# Patient Record
Sex: Female | Born: 1960 | Race: White | Hispanic: No | Smoking: Former smoker
Health system: Southern US, Community
[De-identification: ages and names within clinical notes are randomized; demographics above are authoritative.]

## PROBLEM LIST (undated history)

## (undated) DIAGNOSIS — B019 Varicella without complication: Secondary | ICD-10-CM

## (undated) DIAGNOSIS — R519 Headache, unspecified: Secondary | ICD-10-CM

## (undated) DIAGNOSIS — J449 Chronic obstructive pulmonary disease, unspecified: Secondary | ICD-10-CM

## (undated) DIAGNOSIS — IMO0002 Reserved for concepts with insufficient information to code with codable children: Secondary | ICD-10-CM

## (undated) DIAGNOSIS — E079 Disorder of thyroid, unspecified: Secondary | ICD-10-CM

## (undated) DIAGNOSIS — G43909 Migraine, unspecified, not intractable, without status migrainosus: Secondary | ICD-10-CM

## (undated) DIAGNOSIS — T7840XA Allergy, unspecified, initial encounter: Secondary | ICD-10-CM

## (undated) DIAGNOSIS — R51 Headache: Secondary | ICD-10-CM

## (undated) HISTORY — DX: Varicella without complication: B01.9

## (undated) HISTORY — DX: Allergy, unspecified, initial encounter: T78.40XA

## (undated) HISTORY — DX: Chronic obstructive pulmonary disease, unspecified: J44.9

## (undated) HISTORY — PX: CHOLECYSTECTOMY: SHX55

## (undated) HISTORY — DX: Migraine, unspecified, not intractable, without status migrainosus: G43.909

## (undated) HISTORY — DX: Reserved for concepts with insufficient information to code with codable children: IMO0002

## (undated) HISTORY — PX: SMALL INTESTINE SURGERY: SHX150

## (undated) HISTORY — DX: Headache: R51

## (undated) HISTORY — DX: Disorder of thyroid, unspecified: E07.9

## (undated) HISTORY — DX: Headache, unspecified: R51.9

---
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
## 1984-06-01 DIAGNOSIS — E079 Disorder of thyroid, unspecified: Secondary | ICD-10-CM

## 1984-06-01 HISTORY — DX: Disorder of thyroid, unspecified: E07.9

## 1988-06-01 HISTORY — PX: TUBAL LIGATION: SHX77

## 2002-06-01 HISTORY — PX: STOMACH SURGERY: SHX791

## 2002-11-30 HISTORY — PX: OTHER SURGICAL HISTORY: SHX169

## 2004-03-04 ENCOUNTER — Ambulatory Visit: Payer: Self-pay | Admitting: Internal Medicine

## 2004-03-12 ENCOUNTER — Ambulatory Visit: Payer: Self-pay | Admitting: Internal Medicine

## 2004-04-04 ENCOUNTER — Ambulatory Visit: Payer: Self-pay | Admitting: Internal Medicine

## 2004-06-22 LAB — HM COLONOSCOPY: HM Colonoscopy: NORMAL

## 2006-04-19 ENCOUNTER — Emergency Department: Payer: Self-pay | Admitting: Emergency Medicine

## 2006-07-18 ENCOUNTER — Emergency Department: Payer: Self-pay | Admitting: Emergency Medicine

## 2006-10-06 ENCOUNTER — Emergency Department: Payer: Self-pay | Admitting: Emergency Medicine

## 2007-04-18 ENCOUNTER — Emergency Department: Payer: Self-pay | Admitting: Emergency Medicine

## 2007-07-15 ENCOUNTER — Emergency Department: Payer: Self-pay | Admitting: Emergency Medicine

## 2008-08-15 ENCOUNTER — Emergency Department: Payer: Self-pay | Admitting: Unknown Physician Specialty

## 2010-06-21 LAB — HM MAMMOGRAPHY

## 2011-02-25 ENCOUNTER — Ambulatory Visit: Payer: Self-pay | Admitting: Family Medicine

## 2011-06-08 ENCOUNTER — Ambulatory Visit: Payer: Self-pay | Admitting: Family Medicine

## 2011-07-15 LAB — HM PAP SMEAR: HM Pap smear: NORMAL

## 2012-06-01 DIAGNOSIS — J449 Chronic obstructive pulmonary disease, unspecified: Secondary | ICD-10-CM

## 2012-06-01 HISTORY — DX: Chronic obstructive pulmonary disease, unspecified: J44.9

## 2012-06-21 LAB — HM PAP SMEAR: HM PAP: NORMAL

## 2013-05-01 LAB — CBC AND DIFFERENTIAL
Hemoglobin: 13.7 g/dL (ref 12.0–16.0)
PLATELETS: 271 10*3/uL (ref 150–399)
WBC: 4.6 10^3/mL

## 2013-05-01 LAB — BASIC METABOLIC PANEL
BUN: 10 mg/dL (ref 4–21)
Creatinine: 0.9 mg/dL (ref 0.5–1.1)
Glucose: 94 mg/dL
Potassium: 4.4 mmol/L (ref 3.4–5.3)
SODIUM: 141 mmol/L (ref 137–147)

## 2013-05-01 LAB — HEPATIC FUNCTION PANEL
ALT: 20 U/L (ref 7–35)
AST: 23 U/L (ref 13–35)
Alkaline Phosphatase: 82 U/L (ref 25–125)
Bilirubin, Total: 0.2 mg/dL

## 2013-05-08 ENCOUNTER — Ambulatory Visit: Payer: Self-pay | Admitting: Family Medicine

## 2013-06-21 ENCOUNTER — Ambulatory Visit (INDEPENDENT_AMBULATORY_CARE_PROVIDER_SITE_OTHER): Payer: Managed Care, Other (non HMO) | Admitting: Internal Medicine

## 2013-06-21 ENCOUNTER — Encounter: Payer: Self-pay | Admitting: Internal Medicine

## 2013-06-21 ENCOUNTER — Encounter (INDEPENDENT_AMBULATORY_CARE_PROVIDER_SITE_OTHER): Payer: Self-pay

## 2013-06-21 VITALS — BP 132/74 | HR 73 | Temp 98.1°F | Resp 16 | Ht 64.1 in | Wt 107.5 lb

## 2013-06-21 DIAGNOSIS — Z1239 Encounter for other screening for malignant neoplasm of breast: Secondary | ICD-10-CM

## 2013-06-21 DIAGNOSIS — K2970 Gastritis, unspecified, without bleeding: Secondary | ICD-10-CM

## 2013-06-21 DIAGNOSIS — R634 Abnormal weight loss: Secondary | ICD-10-CM

## 2013-06-21 DIAGNOSIS — Z8719 Personal history of other diseases of the digestive system: Secondary | ICD-10-CM

## 2013-06-21 DIAGNOSIS — K2990 Gastroduodenitis, unspecified, without bleeding: Secondary | ICD-10-CM

## 2013-06-21 DIAGNOSIS — R1013 Epigastric pain: Secondary | ICD-10-CM

## 2013-06-21 DIAGNOSIS — Z7189 Other specified counseling: Secondary | ICD-10-CM

## 2013-06-21 DIAGNOSIS — G8929 Other chronic pain: Secondary | ICD-10-CM

## 2013-06-21 DIAGNOSIS — E039 Hypothyroidism, unspecified: Secondary | ICD-10-CM

## 2013-06-21 DIAGNOSIS — F17200 Nicotine dependence, unspecified, uncomplicated: Secondary | ICD-10-CM

## 2013-06-21 DIAGNOSIS — Z1211 Encounter for screening for malignant neoplasm of colon: Secondary | ICD-10-CM

## 2013-06-21 DIAGNOSIS — Z716 Tobacco abuse counseling: Secondary | ICD-10-CM

## 2013-06-21 DIAGNOSIS — K5900 Constipation, unspecified: Secondary | ICD-10-CM | POA: Insufficient documentation

## 2013-06-21 DIAGNOSIS — Z720 Tobacco use: Secondary | ICD-10-CM

## 2013-06-21 NOTE — Patient Instructions (Signed)
Please get your blood drawn today at [facility] so we can fill your thyroid medication  Trial of daily nexium for your stomach issues  Stop the Excedrin,  Use tylenol for headaches.  Avoid any product that has "salicylic acid" because that mean it has acid in it  I will review the [facility]  Mammogram has been ordered  Follow up in 6 months, sooner if we do not find a source for the weight loss or if th abdominal pain does not improve  The take home stool test will check for microscopic anounts of blood which may indicate another ulcer or a colon polyp

## 2013-06-21 NOTE — Progress Notes (Addendum)
Patient ID: Katie Booth, female   DOB: November 05, 1960, 53 y.o.   MRN: [id number]  Patient Active Problem List   Diagnosis Date Noted  . Abdominal pain, chronic, epigastric 06/22/2013  . Unspecified hypothyroidism 06/22/2013  . Tobacco abuse 06/22/2013  . Screening for breast cancer 06/22/2013  . Screening for colon cancer 06/22/2013  . Tobacco abuse counseling 06/22/2013  . Unspecified constipation 06/21/2013    Subjective:  CC:   Chief Complaint  Patient presents with  . Establish Care    HPI:   Katie Booth is a 53 y.o. female who presents as a new patient to establish primary care with the chief complaint of  Multiple issues to discuss.   Left axillary pain.  Occurred about two weeks ago, no swelling, but pain would inttrmittently feel like pressure and tenderness and then become moderately painful but never severe,  No history of URI of strenuous activity.  Not aggravated by movement of arm.  Resolved currently.   Hypothyroidism , occurred 1 yr post partum .  Had gained > 30 lbs with pregnancy and could not lose the weight despite dieting using very severe calorie restriction.  last TSH October dose was changed  And needs repeat TSH.  Recurrent episodes of  mid epigastric pain occurring at rest, often after she takes the thyroid medication ,  Onset about 45 minutes later,  At times severe,  Relieved with ranitidine  Had a skin papule occur on her chest wall that was was tender to palpation and hard to the touch.  Occurred this summer,  Was present for several weeks,  Then "fell off" in the shower, skin underneath was pink.,  Has not recurred.   History of duodenal ulcer which rupturde with bowel perforation in 2004.  Occurred after months of recurrent emesis.   Had been taking goody powders excessively for headaches.  Was hospitalized at [facility] for the surgery  , after being misdiagnosed at [facility] with a pelvic ultrasound done one month prior to rupture.   Now Uses Excedrin for  headaches,  Averages 2 pills several times per week. Not aware that it had aspirin in it.  Also Takes "sinus tablet" frequently for ronic congestion;  tylenol   Has lost 20 lbs since June unintentionally.  (wanted to lose 7 lbs)  Appetite voracious.   Smoker,  Not ready to quit,  Had a chest x ray several weeks ago at [facility]  Which noted early COPD .   Uses a stool softener daily  Has BMS 3/week no straining,  No blood.       Past Medical History  Diagnosis Date  . Chicken pox 1968  . COPD (chronic obstructive pulmonary disease) 2014    [facility]  . Sinus headache   . Ulcer   . Allergy   . Thyroid disease 1986  . Migraines     Past Surgical History  Procedure Laterality Date  . Tubal ligation  1990  . Cyst ecytomy    . Cholecystectomy    . Tonsillectomy and adenoidectomy  1964  . Stomach surgery  2004  . Small intestine surgery      Family History  Problem Relation Age of Onset  . Adopted: Yes  . Family history unknown: Yes    History   Social History  . Marital Status: Divorced    Spouse Name: N/A    Number of Children: N/A  . Years of Education: N/A   Occupational History  . Not on file.  Social History Main Topics  . Smoking status: Current Every Day Smoker -- 0.50 packs/day    Types: Cigarettes  . Smokeless tobacco: Never Used  . Alcohol Use: No     Comment: occasionally wine  . Drug Use: No  . Sexual Activity: No   Other Topics Concern  . Not on file   Social History Narrative  . No narrative on file   Allergies  Allergen Reactions  . Bee Venom Anaphylaxis  . Penicillins Anaphylaxis      Review of Systems:   The remainder of the review of systems was negative except those addressed in the HPI.   Objective:  BP 132/74  Pulse 73  Temp(Src) 98.1 F (36.7 C) (Oral)  Resp 16  Ht 5' 4.1" (1.628 m)  Wt 107 lb 8 oz (48.762 kg)  BMI 18.40 kg/m2  SpO2 93%  General appearance: alert, cooperative and appears stated age Ears: normal  TM's and external ear canals both ears Throat: lips, mucosa, and tongue normal; teeth and gums normal Neck: no adenopathy, no carotid bruit, supple, symmetrical, trachea midline and thyroid not enlarged, symmetric, no tenderness/mass/nodules Back: symmetric, no curvature. ROM normal. No CVA tenderness. Lungs: clear to auscultation bilaterally Axillary: no axillary masses or lymphadenoapthy Heart: regular rate and rhythm, S1, S2 normal, no murmur, click, rub or gallop Abdomen: soft, non-tender; bowel sounds normal; no masses,  no organomegaly Pulses: 2+ and symmetric Skin: Skin color, texture, turgor normal. No rashes or lesions Lymph nodes: Cervical, supraclavicular, and axillary nodes normal.  Assessment and Plan:  Abdominal pain, chronic, epigastric She has no rebound or guarding on exam to suggest recurrence of perforated viscus secondary to duodenal ulcer. However her recurrent use of Excedrin is concerning for gastritis or early ulcer. She was unaware that Excedrin is aspirin containing. Education given. Proton pump inhibitor samples given of Nexium . Patient sent home with fecal occult blood testing. Records requested from [facility].  Unspecified hypothyroidism History of onset occurred postpartum. She had her thyroid medication dose change in October and is due for repeat TSH. She is a [employer] and will have her labs drawn at facility tomorrow.  Screening for colon cancer She reportedly had a colonoscopy in 2006 for hematochezia. Records requested.  Screening for breast cancer Her last mammogram was in 2012 or  2013. This is been ordered today.  Unintended weight loss Her unintended weight loss of 20 pounds may be due to overactive thyroid versus occult malignancy. TSH is pending. She has had a recent chest x-ray which did not show any lesions. Mammogram has been ordered. She is up-to-date on screening for cervical and colon cancer. Nevertheless I have sent her  home with a take home stool card which is positive will require repeat endoscopy and colonoscopy.   Tobacco abuse Patient has evidence of emphysema by  diagnostic chest x-ray which was obtained by review of records available on [place] [facility]. She was noted by lateral apical scarring which is considered to be unchanged from prior imaging.  History of duodenal ulcer Treated at [facility] in 2004 for perforated viscus secondary to duodenal ulcer secondary to overuse of aspirin products.  Followup at [facility] until 2006 per year of records available online. She had a history of persistent vomiting and continued use of aspirin products subsequently. Workup was negative for gastroparesis.    Updated Medication List Outpatient Encounter Prescriptions as of 06/21/2013  Medication Sig  . levothyroxine (SYNTHROID, LEVOTHROID) 137 MCG tablet  Take 1 tablet by mouth daily before breakfast.

## 2013-06-22 ENCOUNTER — Encounter: Payer: Self-pay | Admitting: Internal Medicine

## 2013-06-22 DIAGNOSIS — Z720 Tobacco use: Secondary | ICD-10-CM | POA: Insufficient documentation

## 2013-06-22 DIAGNOSIS — Z1239 Encounter for other screening for malignant neoplasm of breast: Secondary | ICD-10-CM | POA: Insufficient documentation

## 2013-06-22 DIAGNOSIS — G8929 Other chronic pain: Secondary | ICD-10-CM | POA: Insufficient documentation

## 2013-06-22 DIAGNOSIS — R1013 Epigastric pain: Secondary | ICD-10-CM

## 2013-06-22 DIAGNOSIS — E039 Hypothyroidism, unspecified: Secondary | ICD-10-CM | POA: Insufficient documentation

## 2013-06-22 DIAGNOSIS — Z8719 Personal history of other diseases of the digestive system: Secondary | ICD-10-CM | POA: Insufficient documentation

## 2013-06-22 DIAGNOSIS — Z716 Tobacco abuse counseling: Secondary | ICD-10-CM | POA: Insufficient documentation

## 2013-06-22 DIAGNOSIS — R634 Abnormal weight loss: Secondary | ICD-10-CM | POA: Insufficient documentation

## 2013-06-22 DIAGNOSIS — Z1211 Encounter for screening for malignant neoplasm of colon: Secondary | ICD-10-CM | POA: Insufficient documentation

## 2013-06-22 NOTE — Assessment & Plan Note (Signed)
Her last mammogram was in 2012 or  2013. This is been ordered today.

## 2013-06-22 NOTE — Assessment & Plan Note (Addendum)
History of onset occurred postpartum. She had her thyroid medication dose change in October and is due for repeat TSH. She is a [employer] employee and will have her labs drawn at facility tomorrow.  Repeat TSH is supressed.  Dose lowered,  Repeat TSH i 6 weeks

## 2013-06-22 NOTE — Assessment & Plan Note (Signed)
Her unintended weight loss of 20 pounds may be due to overactive thyroid versus occult malignancy. TSH is pending. She has had a recent chest x-ray which did not show any lesions. Mammogram has been ordered. She is up-to-date on screening for cervical and colon cancer. Nevertheless I have sent her home with a take home stool card which is positive will require repeat endoscopy and colonoscopy.

## 2013-06-22 NOTE — Assessment & Plan Note (Addendum)
She has no rebound or guarding on exam to suggest recurrence of perforated viscus secondary to duodenal ulcer. However her recurrent use of Excedrin is concerning for gastritis or early ulcer. She was unaware that Excedrin is aspirin containing. Education given. Proton pump inhibitor samples given of Nexium . Patient sent home with fecal occult blood testing. [facility].

## 2013-06-22 NOTE — Assessment & Plan Note (Signed)
[facility] for perforated viscus secondary to duodenal ulcer secondary to overuse of aspirin products.  [facility] per year of records available online. She had a history of persistent vomiting and continued use of aspirin products subsequently. Workup was negative for gastroparesis.

## 2013-06-22 NOTE — Assessment & Plan Note (Signed)
Patient has evidence of emphysema by  diagnostic chest x-ray which was obtained by review of records available on [place] [facility]. She was noted by lateral apical scarring which is considered to be unchanged from prior imaging.

## 2013-06-22 NOTE — Assessment & Plan Note (Signed)
She reportedly had a colonoscopy in 2006 for hematochezia. Records requested.

## 2013-06-23 ENCOUNTER — Encounter: Payer: Self-pay | Admitting: Emergency Medicine

## 2013-06-23 LAB — H. PYLORI ANTIBODY, IGG: H Pylori IgG: <0.9 U/mL (ref 0.0–0.8)

## 2013-06-23 LAB — TSH: TSH: 0.091 u[IU]/mL — ABNORMAL LOW (ref 0.450–4.500)

## 2013-06-25 MED ORDER — LEVOTHYROXINE SODIUM 112 MCG PO TABS: 112.0000 ug | ORAL_TABLET | Freq: Every day | ORAL | Status: DC

## 2013-06-25 NOTE — Addendum Note (Signed)
Addended by: Sherlene ShamsULLO, Hardie Veltre L on: 06/25/2013 08:20 AM   Modules accepted: Orders

## 2013-06-27 ENCOUNTER — Other Ambulatory Visit: Payer: Self-pay | Admitting: *Deleted

## 2013-06-27 DIAGNOSIS — E039 Hypothyroidism, unspecified: Secondary | ICD-10-CM

## 2013-06-27 MED ORDER — LEVOTHYROXINE SODIUM 112 MCG PO TABS: 112.0000 ug | ORAL_TABLET | Freq: Every day | ORAL | Status: DC

## 2013-07-02 ENCOUNTER — Telehealth: Payer: Self-pay | Admitting: Internal Medicine

## 2013-07-04 ENCOUNTER — Telehealth: Payer: Self-pay | Admitting: Internal Medicine

## 2013-07-04 NOTE — Telephone Encounter (Signed)
Relevant patient education assigned to patient using Emmi. ° °

## 2013-07-05 ENCOUNTER — Ambulatory Visit: Payer: Self-pay | Admitting: Internal Medicine

## 2013-07-05 DIAGNOSIS — R920 Mammographic microcalcification found on diagnostic imaging of breast: Secondary | ICD-10-CM | POA: Insufficient documentation

## 2013-07-07 ENCOUNTER — Encounter: Payer: Self-pay | Admitting: General Surgery

## 2013-07-07 ENCOUNTER — Telehealth: Payer: Self-pay | Admitting: Emergency Medicine

## 2013-07-07 ENCOUNTER — Ambulatory Visit: Payer: Self-pay | Admitting: Internal Medicine

## 2013-07-07 NOTE — Telephone Encounter (Signed)
I spoke with patient about need for biopsy  Please call her on the 222 phone number ,  She does not use the other.

## 2013-07-07 NOTE — Telephone Encounter (Signed)
Pt aware of apt.  

## 2013-07-07 NOTE — Telephone Encounter (Signed)
Pt is scheduled with [facility] on 07/12/13 at 130. Arriving @ 1pm. I have no contacted the patient yet. PT is aware per [facility] she needs biopsy, they will let her know she will expect call from out office.

## 2013-07-07 NOTE — Telephone Encounter (Signed)
Per TT, use [facility].

## 2013-07-07 NOTE — Telephone Encounter (Signed)
Marchelle FolksAmanda from [facility] called to inform us the patient has been given a BiRads 4 mammogram, recommended biopsy of R breast for calcifications. Surgeon or [facility]?

## 2013-07-12 ENCOUNTER — Ambulatory Visit (INDEPENDENT_AMBULATORY_CARE_PROVIDER_SITE_OTHER): Payer: Managed Care, Other (non HMO) | Admitting: General Surgery

## 2013-07-12 ENCOUNTER — Encounter: Payer: Self-pay | Admitting: General Surgery

## 2013-07-12 VITALS — BP 120/74 | HR 86 | Resp 12 | Ht 64.0 in | Wt 111.0 lb

## 2013-07-12 DIAGNOSIS — R928 Other abnormal and inconclusive findings on diagnostic imaging of breast: Secondary | ICD-10-CM

## 2013-07-12 DIAGNOSIS — R920 Mammographic microcalcification found on diagnostic imaging of breast: Secondary | ICD-10-CM

## 2013-07-12 NOTE — Progress Notes (Signed)
Patient ID: Katie Booth, female   DOB: January 14, 1961, 53 y.o.   MRN: [id number]  Chief Complaint  Patient presents with  . Other    mammogram    HPI Katie Booth is a 53 y.o. female who presents for a breast evaluation. The most recent mammogram  was done on 07/05/13. Patient does perform regular self breast checks and  does not regular mammograms done. Previous mammogram 2013.  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  There is no history of trauma to the breast.  HPI  Past Medical History  Diagnosis Date  . Chicken pox 1968  . COPD (chronic obstructive pulmonary disease) 2014    [facility]  . Sinus headache   . Ulcer   . Allergy   . Thyroid disease 1986  . Migraines     Past Surgical History  Procedure Laterality Date  . Tubal ligation  1990  . Cyst ecytomy  11/2002  . Cholecystectomy    . Tonsillectomy and adenoidectomy  1964  . Stomach surgery  2004  . Small intestine surgery      Family History  Problem Relation Age of Onset  . Adopted: Yes    Social History History  Substance Use Topics  . Smoking status: Current Every Day Smoker -- 0.50 packs/day    Types: Cigarettes  . Smokeless tobacco: Never Used  . Alcohol Use: 0.0 oz/week     Comment: occasionally wine    Allergies  Allergen Reactions  . Bee Venom Anaphylaxis  . Penicillins Anaphylaxis    Current Outpatient Prescriptions  Medication Sig Dispense Refill  . aspirin-acetaminophen-caffeine (EXCEDRIN MIGRAINE) 250-250-65 MG per tablet Take by mouth every 6 (six) hours as needed for headache.      . levothyroxine (SYNTHROID, LEVOTHROID) 112 MCG tablet Take 1 tablet (112 mcg total) by mouth daily before breakfast.  90 tablet  0   No current facility-administered medications for this visit.    Review of Systems Review of Systems  Constitutional: Negative.   Respiratory: Negative.   Cardiovascular: Negative.     Blood pressure 120/74, pulse 86, resp. rate  12, height 5\' 4"  (1.626 m), weight 111 lb (50.349 kg).  Physical Exam Physical Exam  Constitutional: She is oriented to person, place, and time. She appears well-developed and well-nourished.  Eyes: Conjunctivae are normal.  Neck: Neck supple.  Cardiovascular: Normal rate, regular rhythm and normal heart sounds.   Pulmonary/Chest: Breath sounds normal. Right breast exhibits no inverted nipple, no mass, no nipple discharge, no skin change and no tenderness. Left breast exhibits no inverted nipple, no mass, no nipple discharge, no skin change and no tenderness. Breasts are asymmetrical (left breast smaller than right ).  Lymphadenopathy:    She has no cervical adenopathy.    She has no axillary adenopathy.  Neurological: She is alert and oriented to person, place, and time.  Skin: Skin is warm and dry.    Data Reviewed Screening mammogram dated 07/05/2013 suggested an area of microcalcifications in the right breast more pronounced from past exams. Focal spot compression views completed 07/07/2012 reported these as being indeterminate for which the radiologist recommended biopsy. BI-RAD-4. The left breast was unremarkable.  Assessment    Microcalcifications of the right breast    Plan    The 2013 2015 films are the only images available for review. Calcification were identified in 2013, and are slightly more pronounced at this time.  Has no family history is available for  the patient, it seems prudent to proceed with stereotactic biopsy. The risks and benefits of the procedure were discussed in detail.     Patient has been scheduled for a right breast stere[facility] for [facility]17[facility]13 at 3 pm. [facility] at 2:30 pm. This patient is aware of date, time, and instructions. Patient verbalizes understanding.  Katie Booth, Katie Booth 07/13/2013, 3:07 PM

## 2013-07-12 NOTE — Patient Instructions (Addendum)
Stereotactic Breast Biopsy  A stereotactic breast biopsy is a procedure in which mammography is used in the collection of a sample of breast tissue. Mammography is a type of X-ray exam of the breasts that produces an image called a mammogram. The mammogram allows your health care provider to precisely locate the area of the breast from which a tissue sample will be taken. The tissue is then examined under a microscope to see if cancerous cells are present. A breast biopsy is done when:   A lump, abnormality, or mass is seen in the breast on a breast X-ray (mammogram).   Small calcium deposits (calcifications) are seen in the breast.   The shape or appearance of the breasts changes.   The shape or appearance of the nipples changes. You may have unusual or bloody discharge coming from the nipples, or you may have crusting, retraction, or dimpling of the nipples. A breast biopsy can indicate if you need surgery or other treatment.  LET [facility] KNOW ABOUT:  Any allergies you have.  All medicines you are taking, including vitamins, herbs, eye drops, creams, and over-the-counter medicines.  Previous problems you or members of your family have had with the use of anesthetics.  Any blood disorders you have.  Previous surgeries you have had.  Medical conditions you have. RISKS AND COMPLICATIONS Generally, stereotactic breast biopsy is a safe procedure. However, as with any procedure, complications can occur. Possible complications include:  Infection at the needle-insertion site.   Bleeding or bruising after surgery.  The breast may become altered or deformed as a result of the procedure.  The needle may go through the chest wall into the lung area.  BEFORE THE PROCEDURE  Wear a supportive bra to the procedure.  You will be asked to remove jewelry, dentures, eyeglasses, metal objects, or clothing that might interfere with the X-ray images. You may want to leave  some of these objects at home.  Arrange for someone to drive you home after the procedure if desired. PROCEDURE  A stereotactic breast biopsy is done while you are awake. During the procedure, relax as much as possible. Let your health care provider know if you are uncomfortable, anxious, or in pain. Usually, the only discomfort felt during the procedure is caused by staying in one position for the length of the procedure. This discomfort can be reduced by carefully placed cushions. Most of the time the biopsy is done using a table with openings on it. You will be asked to lie facedown on the table and place your breasts through the openings. Your breast is compressed between metal plates to get good X-ray images. Your skin will be cleaned, and a numbing medicine (local anesthetic) will be injected. A small cut (incision) will be made in your breast. The tip of the biopsy needle will be directed through the incision. Several small pieces of suspicious tissue will be taken. Then, a final set of X-ray images will be obtained. If they show that the suspicious tissue has been mostly or completely removed, a small clip will be left at the biopsy site. This is done so that the biopsy site can be easily located if the results of the biopsy show that the tissue is cancerous.  After the procedure, the incision will be stitched (sutured) or taped and covered with a bandage (dressing). Your health care provider may apply a pressure dressing and an ice pack to prevent bleeding and swelling in the breast.  A  stereotactic breast biopsy can take 30 minutes or more. AFTER THE PROCEDURE  If you are doing well and have no problems, you will be allowed to go home.  Document Released: 02/14/2003 Document Revised: 01/18/2013 Document Reviewed: 12/15/2012 [facility] Patient Information 2014 [place], [place].  Patient has been scheduled for a right breast stereotactic biopsy at [facility] for 07-17-13 at 3 pm. She will check-in at the  [facility] at 2:30 pm. This patient is aware of date, time, and instructions. Patient verbalizes understanding.

## 2013-07-17 ENCOUNTER — Ambulatory Visit: Payer: Self-pay | Admitting: General Surgery

## 2013-07-17 DIAGNOSIS — R920 Mammographic microcalcification found on diagnostic imaging of breast: Secondary | ICD-10-CM

## 2013-07-18 ENCOUNTER — Ambulatory Visit: Payer: Managed Care, Other (non HMO) | Admitting: Internal Medicine

## 2013-07-19 ENCOUNTER — Telehealth: Payer: Self-pay | Admitting: *Deleted

## 2013-07-19 ENCOUNTER — Encounter: Payer: Self-pay | Admitting: Internal Medicine

## 2013-07-19 ENCOUNTER — Ambulatory Visit (INDEPENDENT_AMBULATORY_CARE_PROVIDER_SITE_OTHER): Payer: Managed Care, Other (non HMO) | Admitting: Internal Medicine

## 2013-07-19 VITALS — BP 128/70 | HR 95 | Temp 98.3°F | Resp 16 | Wt 108.8 lb

## 2013-07-19 DIAGNOSIS — Z7189 Other specified counseling: Secondary | ICD-10-CM

## 2013-07-19 DIAGNOSIS — F17200 Nicotine dependence, unspecified, uncomplicated: Secondary | ICD-10-CM

## 2013-07-19 DIAGNOSIS — Z716 Tobacco abuse counseling: Secondary | ICD-10-CM

## 2013-07-19 DIAGNOSIS — Z720 Tobacco use: Secondary | ICD-10-CM

## 2013-07-19 DIAGNOSIS — E039 Hypothyroidism, unspecified: Secondary | ICD-10-CM

## 2013-07-19 DIAGNOSIS — J42 Unspecified chronic bronchitis: Secondary | ICD-10-CM

## 2013-07-19 MED ORDER — BENZONATATE 200 MG PO CAPS: 200.0000 mg | ORAL_CAPSULE | Freq: Three times a day (TID) | ORAL | Status: AC | PRN

## 2013-07-19 MED ORDER — BUDESONIDE-FORMOTEROL FUMARATE 80-4.5 MCG/ACT IN AERO: 2.0000 | INHALATION_SPRAY | Freq: Two times a day (BID) | RESPIRATORY_TRACT | Status: DC

## 2013-07-19 NOTE — Telephone Encounter (Signed)
Notified patient as instructed, patient pleased. Discussed follow-up appointments, patient agrees. Placed in recalls for 6 months with right mammogram.

## 2013-07-19 NOTE — Telephone Encounter (Signed)
Phone call from Dr Oneita Krasubinas preliminary pathology, right breast biopsy, benign with calcifications, will go deeper on one block.

## 2013-07-19 NOTE — Progress Notes (Signed)
Patient ID: SIGRID SCHWEBACH, female   DOB: 1961/03/05, 53 y.o.   MRN: [id number]  Patient Active Problem List   Diagnosis Date Noted  . Unspecified chronic bronchitis 07/19/2013  . Breast microcalcification, mammographic 07/05/2013  . Abdominal pain, chronic, epigastric 06/22/2013  . Unspecified hypothyroidism 06/22/2013  . Tobacco abuse 06/22/2013  . Screening for breast cancer 06/22/2013  . Screening for colon cancer 06/22/2013  . Tobacco abuse counseling 06/22/2013  . Unintended weight loss 06/22/2013  . History of duodenal ulcer 06/22/2013  . Unspecified constipation 06/21/2013    Subjective:  CC:   Chief Complaint  Patient presents with  . Follow-up    For FMLA    HPI:   NITHILA SUMNERS is a 53 y.o. female who presents for evaluation and management of frequent episodes of bronchitis causing missed days of work.  She is requesting FMLA forms to be completed to cover her for any recent or future  unplanned absences from work due to [employer].  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  She states that has had two episodes this year already resulting in missed work, one in January prior to initiating primary care with me,  And on in early February, which I was not aware of until today.  She states that due to bronchitis and recurrent vomiting she missed January 8th, 9, 10 and 12 for the first episode.  Then on Feb 3rd left 1 hour early and was out  Feb 4 -7 due to cough .  No prior diagnosis of COPD ,  Was given an albuterol MDI by former PCP.  Still smoking but trying to quit by reducing daily consumption gradually.  Underwent breast biopsy last week by Dr. Lemar Livings.  Some residual discomfort in area of excision.     Past  Medical History  Diagnosis Date  . Chicken pox 1968  . COPD (chronic obstructive pulmonary disease) 2014    [facility]  . Sinus headache   . Ulcer   . Allergy   . Thyroid disease 1986  . Migraines     Past Surgical History  Procedure Laterality Date  . Tubal ligation  1990  . Cyst ecytomy  11/2002  . Cholecystectomy    . Tonsillectomy and adenoidectomy  1964  . Stomach surgery  2004  . Small intestine surgery         The following portions of the patient's history were reviewed and updated as appropriate: Allergies, current medications, and problem list.    Review of Systems:   Patient denies headache, fevers, malaise, unintentional weight loss, skin rash, eye pain, sinus congestion and sinus pain, sore throat, dysphagia,  hemoptysis , cough, dyspnea, wheezing, chest pain, palpitations, orthopnea, edema, abdominal pain, nausea, melena, diarrhea, constipation, flank pain, dysuria, hematuria, urinary  Frequency, nocturia, numbness, tingling, seizures,  Focal weakness, Loss of consciousness,  Tremor, insomnia, depression, anxiety, and suicidal ideation.     History   Social History  . Marital Status: Divorced    Spouse Name: N/A    Number of Children: N/A  . Years of Education: N/A   Occupational History  . Not on file.   Social History Main Topics  . Smoking status: Current Every Day Smoker -- 0.50 packs/day    Types: Cigarettes  .  Smokeless tobacco: Never Used  . Alcohol Use: 0.0 oz/week     Comment: occasionally wine  . Drug Use: No  . Sexual Activity: No   Other Topics Concern  . Not on file   Social History Narrative  . No narrative on file    Objective:  Filed Vitals:   07/19/13 1341  BP: 128/70  Pulse: 95  Temp: 98.3 F (36.8 C)  Resp: 16     General appearance: alert, cooperative and appears stated age Ears: normal TM's and external ear canals both ears Throat: lips, mucosa, and tongue normal; teeth and gums normal Neck: no adenopathy,  no carotid bruit, supple, symmetrical, trachea midline and thyroid not enlarged, symmetric, no tenderness/mass/nodules Back: symmetric, no curvature. ROM normal. No CVA tenderness. Lungs: clear to auscultation bilaterally Heart: regular rate and rhythm, S1, S2 normal, no murmur, click, rub or gallop Abdomen: soft, non-tender; bowel sounds normal; no masses,  no organomegaly Pulses: 2+ and symmetric Skin: Skin color, texture, turgor normal. No rashes or lesions Lymph nodes: Cervical, supraclavicular, and axillary nodes normal.  Assessment and Plan:  Unspecified chronic bronchitis Has had 2 episodes this year one in January and February  Already complicated by recurrent post tussive emesis,  Symbicort and tessalon perles prescribed today.  Tobacco cessation encouraged and discussed.  PFTs ordered for eval of lung function.  Advised to call office when symptoms recdur to avoid loss of work by treating early   Unspecified hypothyroidism TSH was suppressed last month and dose was reduced.  Her weight loss has stopped and she feels better..  Continue current dose and recheck in a few weeks at [facility].   Tobacco abuse counseling Smoking cessation instruction/counseling given:  counseled patient on the dangers of tobacco use, advised patient to stop smoking, and reviewed strategies to maximize success  A total of 40 minutes was spent with patient more than half of which was spent in counseling, reviewing records from other prviders and coordination of care.   Updated Medication List Outpatient Encounter Prescriptions as of 07/19/2013  Medication Sig  . aspirin-acetaminophen-caffeine (EXCEDRIN MIGRAINE) 250-250-65 MG per tablet Take by mouth every 6 (six) hours as needed for headache.  . levothyroxine (SYNTHROID, LEVOTHROID) 112 MCG tablet Take 1 tablet (112 mcg total) by mouth daily before breakfast.  . benzonatate (TESSALON) 200 MG capsule Take 1 capsule (200 mg total) by mouth 3 (three) times  daily as needed for cough.  . budesonide-formoterol (SYMBICORT) 80-4.5 MCG/ACT inhaler Inhale 2 puffs into the lungs 2 (two) times daily.     Orders Placed This Encounter  Procedures  . Pulmonary function test    No Follow-up on file.

## 2013-07-19 NOTE — Progress Notes (Signed)
Pre-visit discussion using our clinic review tool. No additional management support is needed unless otherwise documented below in the visit note.  

## 2013-07-19 NOTE — Patient Instructions (Signed)
Please start using the symbicort today.   This has a steroid and a bronchodilator for your lungs  2 puffs twice daily every day (to prevent bronchitis) Rinse your mouth after each use to prevent getting thrush  Tessalon capsules one tablet every 8 hours as needed for cough  Pulmonary function tests to be set up by our office to evaluate your risk for COPD

## 2013-07-19 NOTE — Assessment & Plan Note (Addendum)
Has had 2 episodes this year one in January and February  Already complicated by recurrent post tussive emesis,  Symbicort and tessalon perles prescribed today.  Tobacco cessation encouraged and discussed.  PFTs ordered for eval of lung function.  Advised to call office when symptoms recdur to avoid loss of work by treating early

## 2013-07-20 ENCOUNTER — Encounter: Payer: Self-pay | Admitting: Internal Medicine

## 2013-07-20 ENCOUNTER — Encounter: Payer: Self-pay | Admitting: General Surgery

## 2013-07-20 ENCOUNTER — Telehealth: Payer: Self-pay | Admitting: Internal Medicine

## 2013-07-20 LAB — PATHOLOGY REPORT

## 2013-07-20 NOTE — Assessment & Plan Note (Signed)
TSH was suppressed last month and dose was reduced.  Her weight loss has stopped and she feels better..  Continue current dose and recheck in a few weeks at [facility].

## 2013-07-20 NOTE — Assessment & Plan Note (Signed)
Smoking cessation instruction/counseling given:  counseled patient on the dangers of tobacco use, advised patient to stop smoking, and reviewed strategies to maximize success 

## 2013-07-20 NOTE — Telephone Encounter (Signed)
Relevant patient education assigned to patient using Emmi. ° °

## 2013-07-21 ENCOUNTER — Encounter: Payer: Self-pay | Admitting: General Surgery

## 2013-07-24 ENCOUNTER — Ambulatory Visit (INDEPENDENT_AMBULATORY_CARE_PROVIDER_SITE_OTHER): Payer: Managed Care, Other (non HMO) | Admitting: *Deleted

## 2013-07-24 DIAGNOSIS — R928 Other abnormal and inconclusive findings on diagnostic imaging of breast: Secondary | ICD-10-CM

## 2013-07-24 NOTE — Progress Notes (Signed)
Patient here today for follow up post right breast biopsy. Minimal bruising noted.  The patient is aware that a heating pad may be used for comfort as needed.  Aware of pathology. Follow up as scheduled. 

## 2013-07-28 ENCOUNTER — Encounter: Payer: Self-pay | Admitting: Internal Medicine

## 2013-07-31 ENCOUNTER — Encounter: Payer: Self-pay | Admitting: Internal Medicine

## 2013-08-02 ENCOUNTER — Telehealth: Payer: Self-pay | Admitting: *Deleted

## 2013-08-02 NOTE — Telephone Encounter (Signed)
Patient MFLA paperwork clarification on line 7. Patient just received back and is bring in tomorrow so we can clarify and insurance wants faxed back by Friday.

## 2013-08-03 NOTE — Telephone Encounter (Signed)
Pt in office to drop off the paperwork.  Placed in Tullo's box.

## 2013-08-03 NOTE — Telephone Encounter (Signed)
Placed in red folder , needs clarification on line 7 patient just received this paper work back and insurance is asking for it back by 08/04/13

## 2013-08-14 ENCOUNTER — Ambulatory Visit: Payer: Self-pay | Admitting: Internal Medicine

## 2013-08-14 LAB — PULMONARY FUNCTION TEST

## 2013-08-18 ENCOUNTER — Telehealth: Payer: Self-pay | Admitting: Internal Medicine

## 2013-08-18 NOTE — Telephone Encounter (Signed)
[facility] and ask for the interpretation on patient's recent PFTS.  I have the raw data but not the conclusion. [phone] Data is in your red folder

## 2013-08-18 NOTE — Telephone Encounter (Signed)
Called spoke with Meriam SpragueBeverly and she stated she would look into the conclusion to the test and have it faxed over today.FYI

## 2013-08-21 ENCOUNTER — Telehealth: Payer: Self-pay | Admitting: Internal Medicine

## 2013-08-21 DIAGNOSIS — J42 Unspecified chronic bronchitis: Secondary | ICD-10-CM

## 2013-08-21 NOTE — Telephone Encounter (Signed)
COPD is confirmed confirmed by PFTS . aRecommend referral to pulmnologist.  Would she like to proceed

## 2013-08-21 NOTE — Telephone Encounter (Signed)
Patient stated she would prefer to go ahead with referral.

## 2013-08-21 NOTE — Assessment & Plan Note (Signed)
COPD is confirmed confirmed by PFTS

## 2013-08-25 ENCOUNTER — Other Ambulatory Visit (INDEPENDENT_AMBULATORY_CARE_PROVIDER_SITE_OTHER): Payer: Managed Care, Other (non HMO)

## 2013-08-25 DIAGNOSIS — K2990 Gastroduodenitis, unspecified, without bleeding: Principal | ICD-10-CM

## 2013-08-25 DIAGNOSIS — K2970 Gastritis, unspecified, without bleeding: Secondary | ICD-10-CM

## 2013-08-25 LAB — FECAL OCCULT BLOOD, IMMUNOCHEMICAL: Fecal Occult Bld: NEGATIVE

## 2013-08-28 ENCOUNTER — Encounter: Payer: Self-pay | Admitting: *Deleted

## 2013-09-11 ENCOUNTER — Telehealth: Payer: Self-pay | Admitting: Internal Medicine

## 2013-09-11 DIAGNOSIS — J449 Chronic obstructive pulmonary disease, unspecified: Secondary | ICD-10-CM

## 2013-09-11 NOTE — Telephone Encounter (Signed)
Please advise.  Patient would like referral to Pulmonary

## 2013-09-11 NOTE — Telephone Encounter (Signed)
The patient is needing a referral to a pulmonary specialist.

## 2013-09-11 NOTE — Telephone Encounter (Signed)
Referral is in process as requested 

## 2013-09-12 ENCOUNTER — Telehealth: Payer: Self-pay | Admitting: *Deleted

## 2013-09-12 NOTE — Telephone Encounter (Signed)
Detailed message left per DPR 

## 2013-09-12 NOTE — Telephone Encounter (Signed)
Patient needs order for [facility] for TSH to check thyroid Please advise.

## 2013-09-12 NOTE — Telephone Encounter (Signed)
Please use [facility] order form and check box,  i will sign

## 2013-09-12 NOTE — Telephone Encounter (Signed)
[facility] [address] [facility]

## 2013-09-20 ENCOUNTER — Ambulatory Visit: Payer: Managed Care, Other (non HMO) | Admitting: General Surgery

## 2013-09-28 ENCOUNTER — Telehealth: Payer: Self-pay | Admitting: Internal Medicine

## 2013-09-28 DIAGNOSIS — E039 Hypothyroidism, unspecified: Secondary | ICD-10-CM

## 2013-09-28 MED ORDER — LEVOTHYROXINE SODIUM 125 MCG PO TABS: 125.0000 ug | ORAL_TABLET | Freq: Every day | ORAL | Status: DC

## 2013-09-28 NOTE — Telephone Encounter (Signed)
Her thyroid is extremely underactive.  Has she run out of her medication or missed  Bunch of doses? Or is she taking it incorrectly?   Higher dose sent to pharmacy repeat TSH in 6 week s

## 2013-09-28 NOTE — Telephone Encounter (Signed)
Patient stated she has not missed " but maybe a couple of doses here and there" advised patient a dose missed once in a while would not do this and that a new script has been called to pharmacy to start.

## 2013-10-04 ENCOUNTER — Encounter (INDEPENDENT_AMBULATORY_CARE_PROVIDER_SITE_OTHER): Payer: Self-pay

## 2013-10-04 ENCOUNTER — Encounter: Payer: Self-pay | Admitting: Pulmonary Disease

## 2013-10-04 ENCOUNTER — Ambulatory Visit (INDEPENDENT_AMBULATORY_CARE_PROVIDER_SITE_OTHER): Payer: Managed Care, Other (non HMO) | Admitting: Pulmonary Disease

## 2013-10-04 VITALS — BP 128/64 | HR 83 | Ht 64.0 in | Wt 113.0 lb

## 2013-10-04 DIAGNOSIS — R059 Cough, unspecified: Secondary | ICD-10-CM | POA: Insufficient documentation

## 2013-10-04 DIAGNOSIS — F17200 Nicotine dependence, unspecified, uncomplicated: Secondary | ICD-10-CM

## 2013-10-04 DIAGNOSIS — Z720 Tobacco use: Secondary | ICD-10-CM

## 2013-10-04 DIAGNOSIS — R05 Cough: Secondary | ICD-10-CM | POA: Insufficient documentation

## 2013-10-04 DIAGNOSIS — J42 Unspecified chronic bronchitis: Secondary | ICD-10-CM

## 2013-10-04 MED ORDER — GUAIFENESIN-CODEINE 100-10 MG/5ML PO SYRP
5.0000 mL | ORAL_SOLUTION | Freq: Three times a day (TID) | ORAL | Status: AC | PRN
Start: 2013-10-04 — End: ?

## 2013-10-04 MED ORDER — TIOTROPIUM BROMIDE MONOHYDRATE 18 MCG IN CAPS: 18.0000 ug | ORAL_CAPSULE | Freq: Every day | RESPIRATORY_TRACT | Status: AC

## 2013-10-04 NOTE — Progress Notes (Signed)
Subjective:    Patient ID: Katie Booth, female    DOB: 1960/11/21, 53 y.o.   MRN: [id number]  HPI  This is a very pleasant 53 year old female who smokes about a quarter pack of cigarettes daily he comes to our clinic today for further evaluation of COPD. She has smoked as much as one pack of cigarettes daily for 35 years and recently started coming back. She says that what bothers her most about her COPD is the cough that she experiences at night. She says that whenever she lays down flat she starts to have wheezing, cough, and chest tightness. 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 She does prop her head up. This is typically associated with the acute onset of wheezing and chest tightness.  Just a note shortness of breath and fatigue on exertion which is been increasing over the last 2 years. She does not use Symbicort on a regular basis because she does not like the way it makes her feel. She says that when she uses it it actually helps with her shortness of breath but it makes her feel quite anxious.  Allergies are typically a problem for her in the springtime with sinus congestion. This sometimes will go to her chest.  She has had to go to the doctor 2-3 times a year for the last several years for flares of bronchitis. This is typically treated with prednisone and antibiotics.  She has heartburn which is fairly well controlled right now.  Past Medical History  Diagnosis Date  . Chicken pox 1968  . COPD (chronic obstructive pulmonary disease) 2014    [facility]  . Sinus headache   . Ulcer   . Allergy   . Thyroid disease 1986  . Migraines      Family History  Problem Relation Age of Onset  . Adopted: Yes  . Family history unknown: Yes     History   Social History  . Marital Status: Divorced    Spouse Name: N/A    Number  of Children: N/A  . Years of Education: N/A   Occupational History  . Not on file.   Social History Main Topics  . Smoking status: Current Every Day Smoker -- 0.25 packs/day for 35 years    Types: Cigarettes  . Smokeless tobacco: Never Used  . Alcohol Use: 0.0 oz/week     Comment: occasionally wine  . Drug Use: No  . Sexual Activity: No   Other Topics Concern  . Not on file   Social History Narrative  . No narrative on file     Allergies  Allergen Reactions  . Bee Venom Anaphylaxis  . Penicillins Anaphylaxis     Outpatient Prescriptions Prior to Visit  Medication Sig Dispense Refill  . aspirin-acetaminophen-caffeine (EXCEDRIN MIGRAINE) 250-250-65 MG per tablet Take by mouth every 6 (six) hours as needed for headache.      . benzonatate (TESSALON) 200 MG capsule Take 1 capsule (200 mg total) by mouth 3 (three) times daily as needed for cough.  60 capsule  1  . budesonide-formoterol (SYMBICORT) 80-4.5 MCG/ACT inhaler Inhale 2 puffs into the lungs 2 (two) times daily.  1 Inhaler  12  . levothyroxine (SYNTHROID, LEVOTHROID) 125 MCG tablet Take 1 tablet (125 mcg total) by mouth daily before breakfast.  90 tablet  0  No facility-administered medications prior to visit.      Review of Systems  Constitutional: Positive for fatigue. Negative for fever and unexpected weight change.  HENT: Positive for congestion. Negative for dental problem, ear pain, nosebleeds, postnasal drip, rhinorrhea, sinus pressure, sneezing, sore throat and trouble swallowing.   Eyes: Negative for redness and itching.  Respiratory: Positive for cough, shortness of breath and wheezing. Negative for chest tightness.   Cardiovascular: Negative for palpitations and leg swelling.  Gastrointestinal: Negative for nausea and vomiting.  Genitourinary: Negative for dysuria.  Musculoskeletal: Negative for joint swelling.  Skin: Negative for rash.  Neurological: Negative for headaches.  Hematological: Does not  bruise/bleed easily.  Psychiatric/Behavioral: Negative for dysphoric mood. The patient is not nervous/anxious.        Objective:   Physical Exam Filed Vitals:   10/04/13 1000  BP: 128/64  Pulse: 83  Height: 5\' 4"  (1.626 m)  Weight: 113 lb (51.256 kg)  SpO2: 96%   Gen: well appearing, no acute distress HEENT: NCAT, PERRL, EOMi, OP clear, neck supple without masses PULM: Few wheezes bilaterally, good air movement CV: RRR, no mgr, no JVD AB: BS+, soft, nontender, no hsm Ext: warm, no edema, no clubbing, no cyanosis Derm: no rash or skin breakdown Neuro: A&Ox4, CN II-XII intact, strength 5/5 in all 4 extremities       Assessment & Plan:   Unspecified chronic bronchitis COPD: GOLD GRADE C  Her airflow obstruction is only mild but what bothers me is that she has multiple exacerbations per year. This is a high-risk feature. Her ongoing tobacco use is clearly contributing to this. Also, because she does not use her medications regularly this increases her risk for exacerbations.  -O2 therapy: Not indicated -Immunizations: Flu shot in the fall -Tobacco use: Advised at length to quit -Exercise: Encouraged regular activity -Bronchodilator therapy: Switch to Spriva daily -Exacerbation prevention: Quit smoking, Spriva     Tobacco abuse Advised at length to quit.  She is currently enrolled N/A quit smoking campaign at work. She is afraid of the side effects of Chantix.  Cough This is due to smoking, noncompliant with COPD medications, postnasal drip, and acid reflux.  Plan: -Quit smoking -Start Spiriva daily -GERD lifestyle modification reviewed -Start allergic rhinitis therapy (saline rinse, antihistamine, nasal steroid) disease - Cough syrup prescribed times one no refills   Updated Medication List Outpatient Encounter Prescriptions as of 10/04/2013  Medication Sig  . aspirin-acetaminophen-caffeine (EXCEDRIN MIGRAINE) 250-250-65 MG per tablet Take by mouth every 6  (six) hours as needed for headache.  . benzonatate (TESSALON) 200 MG capsule Take 1 capsule (200 mg total) by mouth 3 (three) times daily as needed for cough.  . levothyroxine (SYNTHROID, LEVOTHROID) 125 MCG tablet Take 1 tablet (125 mcg total) by mouth daily before breakfast.  . pseudoephedrine-acetaminophen (TYLENOL SINUS) 30-500 MG TABS Take 1 tablet by mouth every 6 (six) hours as needed.  . [DISCONTINUED] budesonide-formoterol (SYMBICORT) 80-4.5 MCG/ACT inhaler Inhale 2 puffs into the lungs 2 (two) times daily.  Marland Kitchen. guaiFENesin-codeine (CHERATUSSIN AC) 100-10 MG/5ML syrup Take 5 mLs by mouth 3 (three) times daily as needed for cough.  . tiotropium (SPIRIVA HANDIHALER) 18 MCG inhalation capsule Place 1 capsule (18 mcg total) into inhaler and inhale daily.

## 2013-10-04 NOTE — Assessment & Plan Note (Signed)
COPD: GOLD GRADE C  Her airflow obstruction is only mild but what bothers me is that she has multiple exacerbations per year. This is a high-risk feature. Her ongoing tobacco use is clearly contributing to this. Also, because she does not use her medications regularly this increases her risk for exacerbations.  -O2 therapy: Not indicated -Immunizations: Flu shot in the fall -Tobacco use: Advised at length to quit -Exercise: Encouraged regular activity -Bronchodilator therapy: Switch to Spriva daily -Exacerbation prevention: Quit smoking, Spriva

## 2013-10-04 NOTE — Assessment & Plan Note (Signed)
Advised at length to quit.  She is currently enrolled N/A quit smoking campaign at work. She is afraid of the side effects of Chantix.

## 2013-10-04 NOTE — Assessment & Plan Note (Signed)
This is due to smoking, noncompliant with COPD medications, postnasal drip, and acid reflux.  Plan: -Quit smoking -Start Spiriva daily -GERD lifestyle modification reviewed -Start allergic rhinitis therapy (saline rinse, antihistamine, nasal steroid) disease - Cough syrup prescribed times one no refills

## 2013-10-04 NOTE — Patient Instructions (Signed)
Stop symbicort Start spiriva once daily Quit smoking  Use the cough syrup before going to bed, but don't take it and drive  For cough (related to sinus problems and reflux): Follow the GERD lifestyle sheet Use Neil Med rinses with distilled water at least twice per day using the instructions on the package. 1/2 hour after using the [facility] rinse, use Nasacort two puffs in each nostril once per day. Use generic zyrtec daily If you are not better in 2 weeks, then start taking Prilosec 20mg  oral daily.   We will see you back in 3-4 weeks or sooner if needed

## 2013-10-24 ENCOUNTER — Encounter: Payer: Self-pay | Admitting: Internal Medicine

## 2013-11-22 ENCOUNTER — Telehealth: Payer: Self-pay | Admitting: Internal Medicine

## 2013-11-22 NOTE — Telephone Encounter (Signed)
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 Patient would like to explain why she cannot perform her job. Paper work is in [employer] folder.

## 2013-11-22 NOTE — Telephone Encounter (Signed)
I will not  continue to fill out disability /work excuse forms for COPD for her because she is still smoking and, per Dr Ulyses JarredMcQuaid's evaluation,  Not taking her medications as directed.  She will need an office visit to explain why she feels she needs to stay out of work

## 2013-11-22 NOTE — Telephone Encounter (Signed)
Please call patient before FMLA paper work is faxed by to [facility].

## 2013-11-22 NOTE — Telephone Encounter (Signed)
Patient scheduling appointment and I have paperwork in my hold folder until appointment.

## 2013-11-27 ENCOUNTER — Ambulatory Visit: Payer: Managed Care, Other (non HMO) | Admitting: Internal Medicine

## 2013-12-04 ENCOUNTER — Ambulatory Visit (INDEPENDENT_AMBULATORY_CARE_PROVIDER_SITE_OTHER): Payer: Managed Care, Other (non HMO) | Admitting: Internal Medicine

## 2013-12-04 ENCOUNTER — Encounter: Payer: Self-pay | Admitting: Internal Medicine

## 2013-12-04 VITALS — BP 100/64 | HR 87 | Temp 97.8°F | Resp 16 | Ht 64.0 in | Wt 116.8 lb

## 2013-12-04 DIAGNOSIS — J302 Other seasonal allergic rhinitis: Secondary | ICD-10-CM

## 2013-12-04 DIAGNOSIS — J3089 Other allergic rhinitis: Secondary | ICD-10-CM

## 2013-12-04 DIAGNOSIS — J42 Unspecified chronic bronchitis: Secondary | ICD-10-CM

## 2013-12-04 DIAGNOSIS — Z720 Tobacco use: Secondary | ICD-10-CM

## 2013-12-04 DIAGNOSIS — F17200 Nicotine dependence, unspecified, uncomplicated: Secondary | ICD-10-CM

## 2013-12-04 DIAGNOSIS — E039 Hypothyroidism, unspecified: Secondary | ICD-10-CM

## 2013-12-04 MED ORDER — DESLORATADINE 2.5 MG PO TBDP: 2.5000 mg | ORAL_TABLET | Freq: Every day | ORAL | Status: AC

## 2013-12-04 MED ORDER — PREDNISONE 20 MG PO TABS: 20.0000 mg | ORAL_TABLET | Freq: Every day | ORAL | Status: AC

## 2013-12-04 NOTE — Progress Notes (Signed)
Pre-visit discussion using our clinic review tool. No additional management support is needed unless otherwise documented below in the visit note.  

## 2013-12-04 NOTE — Progress Notes (Signed)
Patient ID: Katie Booth, female   DOB: Oct 20, 1960, 53 y.o.   MRN: [id number]  Patient Active Problem List   Diagnosis Date Noted  . Allergic rhinitis 12/05/2013  . Cough 10/04/2013  . Unspecified chronic bronchitis 07/19/2013  . Breast microcalcification, mammographic 07/05/2013  . Abdominal pain, chronic, epigastric 06/22/2013  . Unspecified hypothyroidism 06/22/2013  . Tobacco abuse 06/22/2013  . Screening for breast cancer 06/22/2013  . Screening for colon cancer 06/22/2013  . Tobacco abuse counseling 06/22/2013  . History of duodenal ulcer 06/22/2013  . Unspecified constipation 06/21/2013    Subjective:  CC:   Chief Complaint  Patient presents with  . Follow-up    FMLA    HPI:   Katie Patellaracy L Karger is a 53 y.o. female who presents for Follow up on chronic  bronchitis secondary to tobacco abuse and frequent exacerbations resulting in missed work..  She  Reports that she has not worked since May 11th because of recurrent  symptoms of runny nose , sneezing , watering itching  eyes which gets progressively worse by the end of the day. Has been sent home in the past due to the disruption it causes in her work.  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  She has quit smoking as of < 30 days ago, and can tell a difference already in her cough and is using her spiriva as directed daily.   Not using the benzonatate.  Much at all lately     Past Medical History  Diagnosis Date  . Chicken pox 1968  . COPD (chronic obstructive pulmonary disease) 2014    [facility]  . Sinus headache   . Ulcer   . Allergy   . Thyroid disease 1986  . Migraines     Past Surgical History  Procedure Laterality Date  . Tubal ligation  1990  . Cyst ecytomy  11/2002  . Cholecystectomy    . Tonsillectomy and adenoidectomy  1964  . Stomach surgery  2004  . Small intestine surgery         The following portions of the patient's history  were reviewed and updated as appropriate: Allergies, current medications, and problem list.    Review of Systems:   Patient denies headache, fevers, malaise, unintentional weight loss, skin rash, eye pain, sinus congestion and sinus pain, sore throat, dysphagia,  hemoptysis , cough, dyspnea, wheezing, chest pain, palpitations, orthopnea, edema, abdominal pain, nausea, melena, diarrhea, constipation, flank pain, dysuria, hematuria, urinary  Frequency, nocturia, numbness, tingling, seizures,  Focal weakness, Loss of consciousness,  Tremor, insomnia, depression, anxiety, and suicidal ideation.     History   Social History  . Marital Status: Divorced    Spouse Name: N/A    Number of Children: N/A  . Years of Education: N/A   Occupational History  . Not on file.   Social History Main Topics  . Smoking status: Former Smoker -- 0.25 packs/day for 35 years    Types: Cigarettes    Quit date: 11/17/2013  . Smokeless tobacco: Never Used  . Alcohol Use: 0.0 oz/week     Comment: occasionally wine  . Drug Use: No  . Sexual Activity: No   Other Topics Concern  . Not on file   Social History Narrative  . No narrative on file    Objective:  Filed Vitals:   12/04/13 1151  BP: 100/64  Pulse: 87  Temp: 97.8 F (36.6 C)  Resp:  16     General appearance: alert, cooperative and appears stated age Ears: normal TM's and external ear canals both ears Throat: lips, mucosa, and tongue normal; teeth and gums normal Neck: no adenopathy, no carotid bruit, supple, symmetrical, trachea midline and thyroid not enlarged, symmetric, no tenderness/mass/nodules Back: symmetric, no curvature. ROM normal. No CVA tenderness. Lungs: clear to auscultation bilaterally Heart: regular rate and rhythm, S1, S2 normal, no murmur, click, rub or gallop Abdomen: soft, non-tender; bowel sounds normal; no masses,  no organomegaly Pulses: 2+ and symmetric Skin: Skin color, texture, turgor normal. No rashes or  lesions Lymph nodes: Cervical, supraclavicular, and axillary nodes normal.  Assessment and Plan:  Unspecified chronic bronchitis Managed with Spiriva, and finally smoking cessation per patient , <30 days ago.  She is wheezing however on exam today .  Predniosne 20 mg x 5 days per patient preference as opposed to medrol dose pack.   Unspecified hypothyroidism TSH was suppressed in January and dose was reduced.  Her weight loss has stopped and she feels better.. Needs repeat TSH at [facility].   Lab Results  Component Value Date   TSH 0.091* 06/21/2013     Tobacco abuse She reports cessation < 30 days ago.  Patient was congratulated and encouraged.   Allergic rhinitis Informed patient that it was unlikely that I would be able to support a 3 month absence from work due to allergic rhinitis when she was noncompliant with Dr Ulyses JarredMcQuaid's advice to start a daily antihistamine, a steroid nasal spray and sinus rinses during May 6 ov.  Advised to start  clarinex today    Updated Medication List Outpatient Encounter Prescriptions as of 12/04/2013  Medication Sig  . aspirin-acetaminophen-caffeine (EXCEDRIN MIGRAINE) 250-250-65 MG per tablet Take by mouth every 6 (six) hours as needed for headache.  . guaiFENesin-codeine (CHERATUSSIN AC) 100-10 MG/5ML syrup Take 5 mLs by mouth 3 (three) times daily as needed for cough.  . levothyroxine (SYNTHROID, LEVOTHROID) 125 MCG tablet Take 1 tablet (125 mcg total) by mouth daily before breakfast.  . pseudoephedrine-acetaminophen (TYLENOL SINUS) 30-500 MG TABS Take 1 tablet by mouth every 6 (six) hours as needed.  . tiotropium (SPIRIVA HANDIHALER) 18 MCG inhalation capsule Place 1 capsule (18 mcg total) into inhaler and inhale daily.  . benzonatate (TESSALON) 200 MG capsule Take 1 capsule (200 mg total) by mouth 3 (three) times daily as needed for cough.  . desloratadine (CLARINEX REDITAB) 2.5 MG disintegrating tablet Take 1 tablet (2.5 mg total) by mouth daily.  .  predniSONE (DELTASONE) 20 MG tablet Take 1 tablet (20 mg total) by mouth daily with breakfast.     No orders of the defined types were placed in this encounter.    No Follow-up on file.

## 2013-12-04 NOTE — Patient Instructions (Addendum)
I am recommending STRONGLY that you start taking an antihistamine DAILY.  I have sent clarinex to your pharmacy to take DAILY .  It only works on the day you take it.  We may have to try other antihistamines if this does not  Work on the runny nose and runny eyes  CONTINUE the spiriva ibhaler daily  Prednisone 20 mg daily for 5 days to help resolve your current wheezing   Take a dose of the cough capsule (benzonotate)  BEFORE you go to work and again at lunchtime to prevent your cough   Allergic Rhinitis Allergic rhinitis is when the mucous membranes in the nose respond to allergens. Allergens are particles in the air that cause your body to have an allergic reaction. This causes you to release allergic antibodies. Through a chain of events, these eventually cause you to release histamine into the blood stream. Although meant to protect the body, it is this release of histamine that causes your discomfort, such as frequent sneezing, congestion, and an itchy, runny nose.  CAUSES  Seasonal allergic rhinitis (hay fever) is caused by pollen allergens that may come from grasses, trees, and weeds. Year-round allergic rhinitis (perennial allergic rhinitis) is caused by allergens such as house dust mites, pet dander, and mold spores.  SYMPTOMS   Nasal stuffiness (congestion).  Itchy, runny nose with sneezing and tearing of the eyes. DIAGNOSIS  Your health care provider can help you determine the allergen or allergens that trigger your symptoms. If you and your health care provider are unable to determine the allergen, skin or blood testing may be used. TREATMENT  Allergic rhinitis does not have a cure, but it can be controlled by:  Medicines and allergy shots (immunotherapy).  Avoiding the allergen. Hay fever may often be treated with antihistamines in pill or nasal spray forms. Antihistamines block the effects of histamine. There are over-the-counter medicines that may help with nasal  congestion and swelling around the eyes. Check with your health care provider before taking or giving this medicine.  If avoiding the allergen or the medicine prescribed do not work, there are many new medicines your health care provider can prescribe. Stronger medicine may be used if initial measures are ineffective. Desensitizing injections can be used if medicine and avoidance does not work. Desensitization is when a patient is given ongoing shots until the body becomes less sensitive to the allergen. Make sure you follow up with your health care provider if problems continue. HOME CARE INSTRUCTIONS It is not possible to completely avoid allergens, but you can reduce your symptoms by taking steps to limit your exposure to them. It helps to know exactly what you are allergic to so that you can avoid your specific triggers. SEEK MEDICAL CARE IF:   You have a fever.  You develop a cough that does not stop easily (persistent).  You have shortness of breath.  You start wheezing.  Symptoms interfere with normal daily activities. Document Released: 02/10/2001 Document Revised: 05/23/2013 Document Reviewed: 01/23/2013 [facility] Patient Information 2015 [employer] [place]. This information is not intended to replace advice given to you by your health care provider. Make sure you discuss any questions you have with your health care provider.

## 2013-12-05 DIAGNOSIS — J309 Allergic rhinitis, unspecified: Secondary | ICD-10-CM | POA: Insufficient documentation

## 2013-12-05 NOTE — Assessment & Plan Note (Addendum)
Managed with Spiriva, and finally smoking cessation per patient , <30 days ago.  She is wheezing however on exam today .  Predniosne 20 mg x 5 days per patient preference as opposed to medrol dose pack.

## 2013-12-05 NOTE — Assessment & Plan Note (Signed)
Informed patient that it was unlikely that I would be able to support a 3 month absence from work due to allergic rhinitis when she was noncompliant with Dr Ulyses JarredMcQuaid's advice to start a daily antihistamine.  Starting clarinex today

## 2013-12-05 NOTE — Assessment & Plan Note (Addendum)
TSH was suppressed in January and dose was reduced.  Her weight loss has stopped and she feels better.. Needs repeat TSH at [facility].   Lab Results  Component Value Date   TSH 0.091* 06/21/2013

## 2013-12-05 NOTE — Assessment & Plan Note (Signed)
She reports cessation < 30 days ago.  Patient was congratulated and encouraged.

## 2013-12-06 ENCOUNTER — Telehealth: Payer: Self-pay | Admitting: Internal Medicine

## 2013-12-06 NOTE — Telephone Encounter (Signed)
The patient is needing you to check your voice mail . She left you a detailed message on instructions that she was given from [employer] .

## 2013-12-06 NOTE — Telephone Encounter (Signed)
Called patient and notified her that all paperwork has been faxed for FMLA.

## 2013-12-28 ENCOUNTER — Telehealth: Payer: Self-pay | Admitting: Internal Medicine

## 2013-12-28 DIAGNOSIS — Z0279 Encounter for issue of other medical certificate: Secondary | ICD-10-CM

## 2013-12-28 NOTE — Telephone Encounter (Signed)
Her disability forms have been completed.  I am NOT supporting her disability claims for allergic rhinitis , and i will nOT fill out these forms a third time .

## 2013-12-29 NOTE — Telephone Encounter (Signed)
Patient notified paper work has been filled out an faxed back.

## 2014-01-18 ENCOUNTER — Ambulatory Visit: Payer: Managed Care, Other (non HMO) | Admitting: General Surgery

## 2014-01-30 ENCOUNTER — Other Ambulatory Visit: Payer: Self-pay | Admitting: *Deleted

## 2014-01-30 DIAGNOSIS — E039 Hypothyroidism, unspecified: Secondary | ICD-10-CM

## 2014-01-30 MED ORDER — LEVOTHYROXINE SODIUM 125 MCG PO TABS: 125.0000 ug | ORAL_TABLET | Freq: Every day | ORAL | Status: DC

## 2014-01-30 NOTE — Progress Notes (Signed)
Patient called for refill on Synthroid 90 day supply sent to [facility].

## 2014-03-22 ENCOUNTER — Encounter: Payer: Self-pay | Admitting: *Deleted

## 2014-04-02 ENCOUNTER — Encounter: Payer: Self-pay | Admitting: Internal Medicine

## 2014-04-09 ENCOUNTER — Telehealth: Payer: Self-pay | Admitting: Pulmonary Disease

## 2014-04-09 NOTE — Telephone Encounter (Signed)
Spoke with the pt  She states that she needs letter addressed to Dr. Darrick Huntsmanullo stating that she takes prescription cough syrup for her "chronic COPD" She reports failed a drug test due to having cough syrup in her system- looks like we prescribed cheratussin  Please advise thanks!

## 2014-04-10 NOTE — Telephone Encounter (Signed)
Letter written and sent to Dr Darrick Huntsmanullo office.  LM to make patient aware.

## 2014-04-10 NOTE — Telephone Encounter (Signed)
Pt returned call.  Advised an letter was written & sen to Dr. Melina Schoolsullo's office.  Antionette FairyHolly D Pryor

## 2014-04-10 NOTE — Telephone Encounter (Signed)
The only thing we can say is that I gave one Rx for this in May 2015 with no refills.

## 2014-05-16 ENCOUNTER — Other Ambulatory Visit: Payer: Self-pay | Admitting: Internal Medicine

## 2014-07-16 ENCOUNTER — Other Ambulatory Visit: Payer: Self-pay | Admitting: Internal Medicine

## 2015-05-05 IMAGING — CR DG CHEST 2V
1 series · 2 of 2 positions shown · non-contrast
Comparison: 02/25/2011

CLINICAL DATA: Chronic cough.  Chest pain

EXAM:
CHEST  2 VIEW

[Series 1: w chest pa · 0.14mm/px · 2 of 2 slices shown]
[im 1/2]
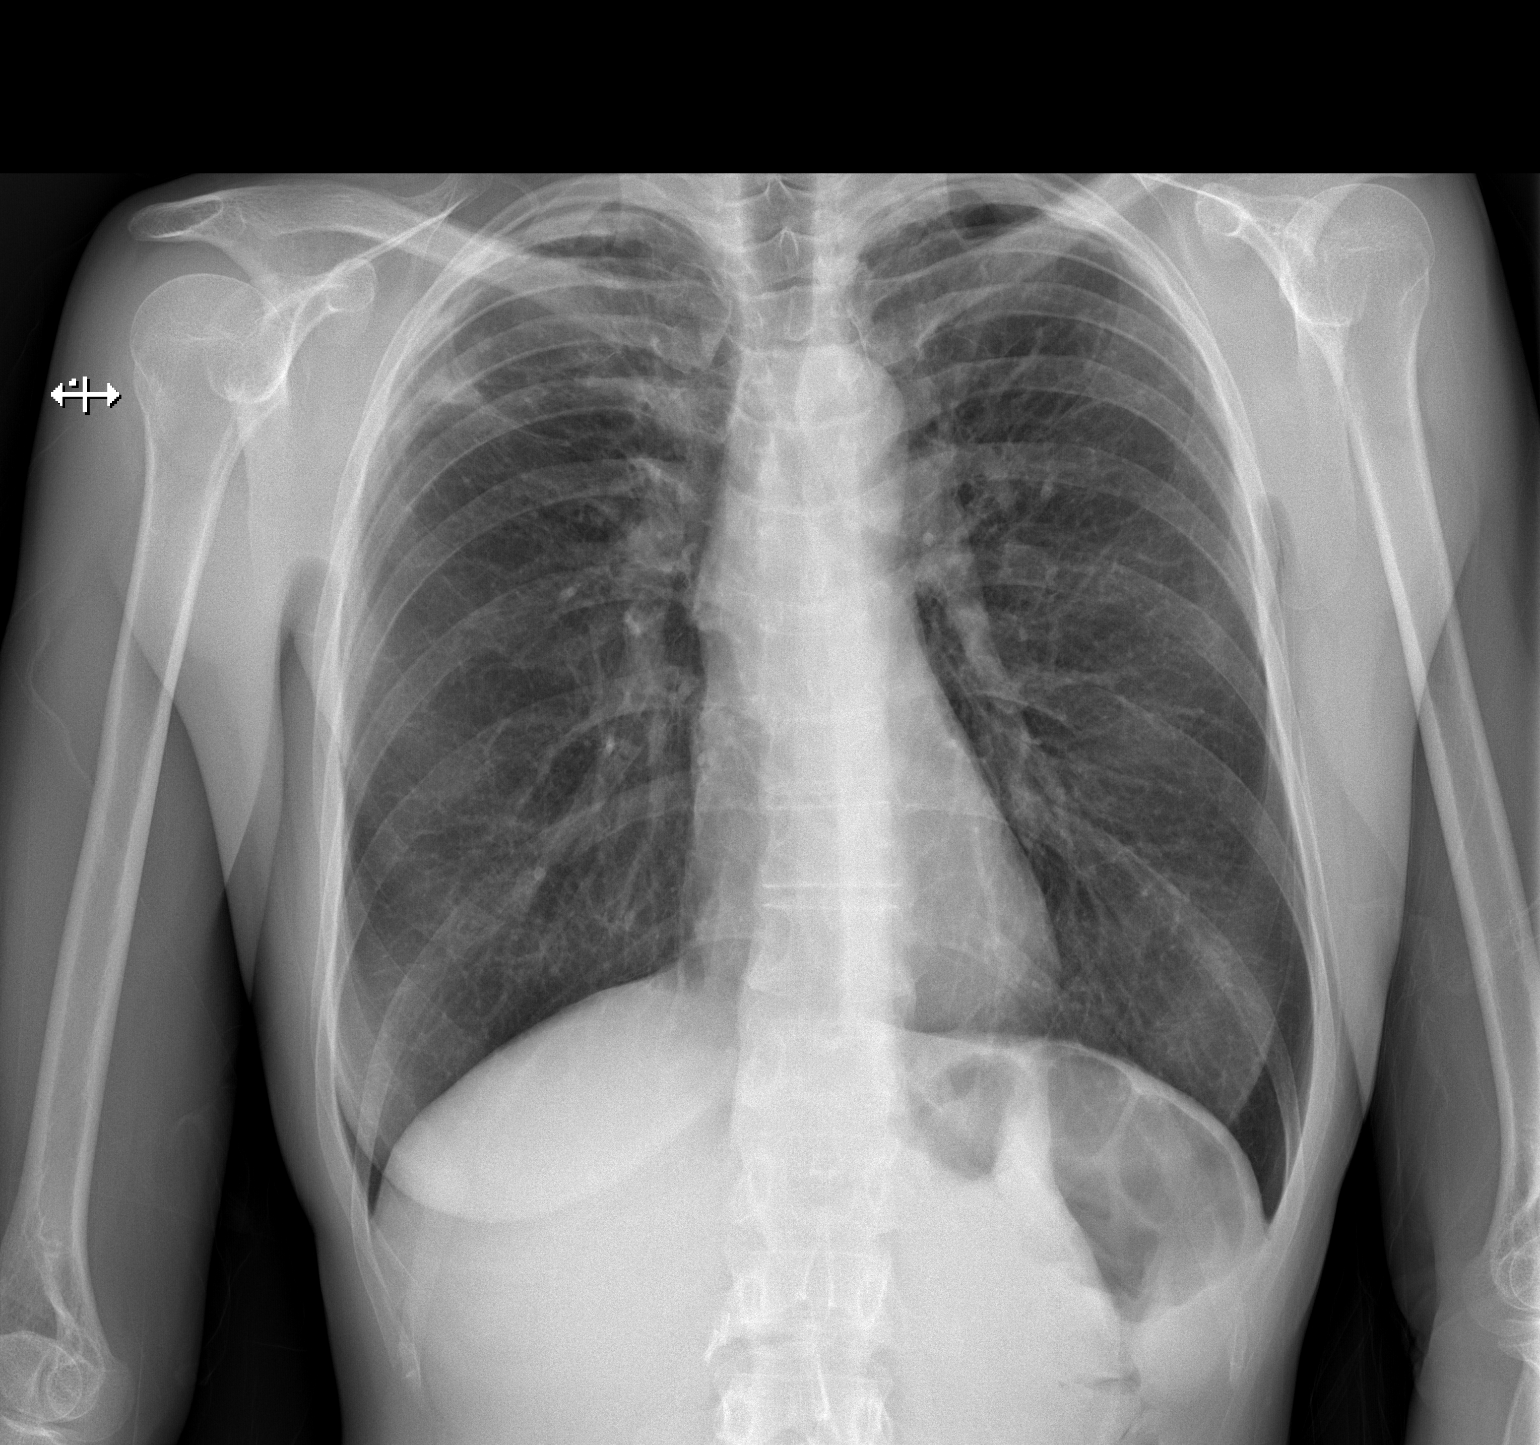
[im 2/2]
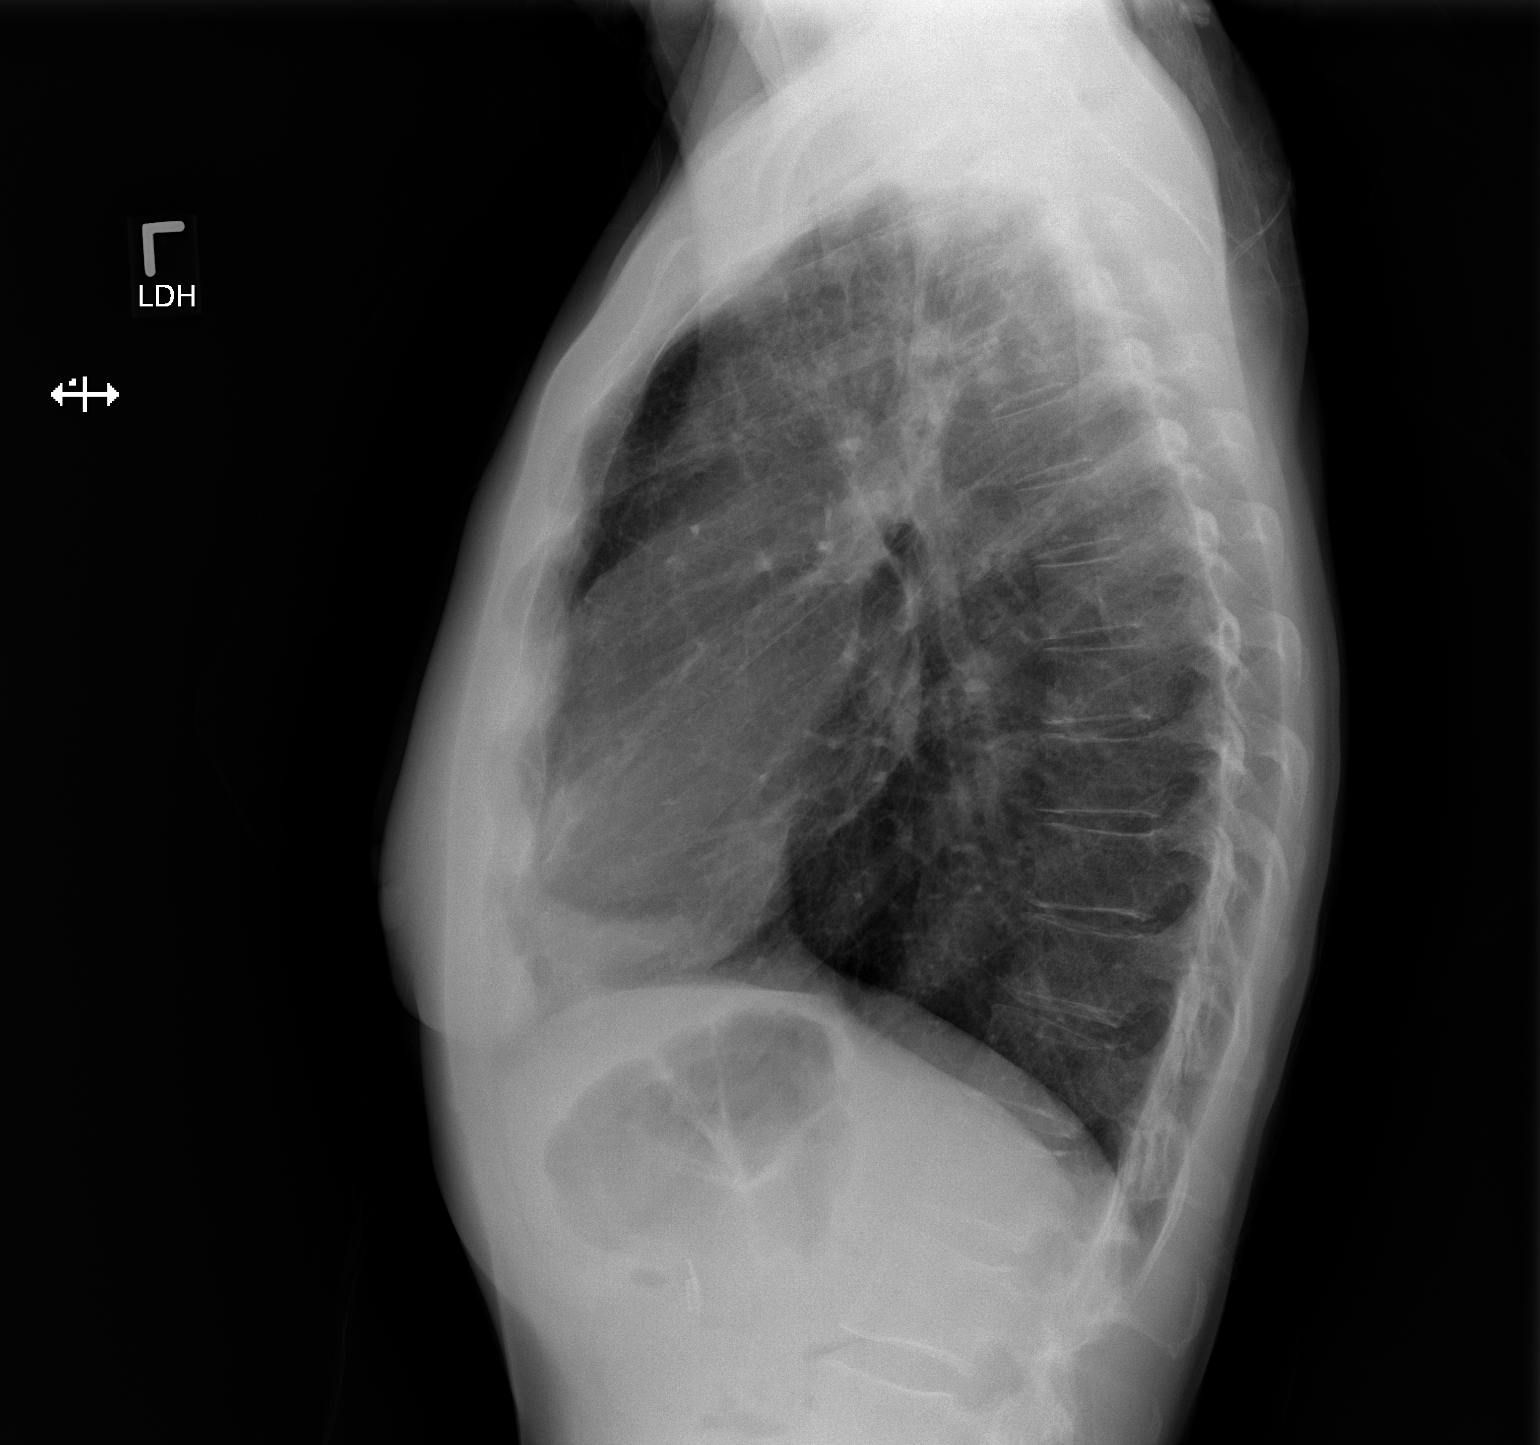

[2 of 2 positions shown; findings below may reference images not displayed]

FINDINGS: Chronic lung disease with mild hyperinflation. Heart size is normal.
Negative for heart failure. Negative for pneumonia or effusion.

Apical scarring bilaterally right greater than left. This is
unchanged from the prior study.
IMPRESSION: COPD with apical scarring is stable.  No acute abnormality.

## 2015-07-14 IMAGING — CR US BIOPSY BREAST CORE VACUUM ASSIST
7 series · 7 of 7 positions shown · non-contrast
Comparison: none

[LM (1 of 7)]
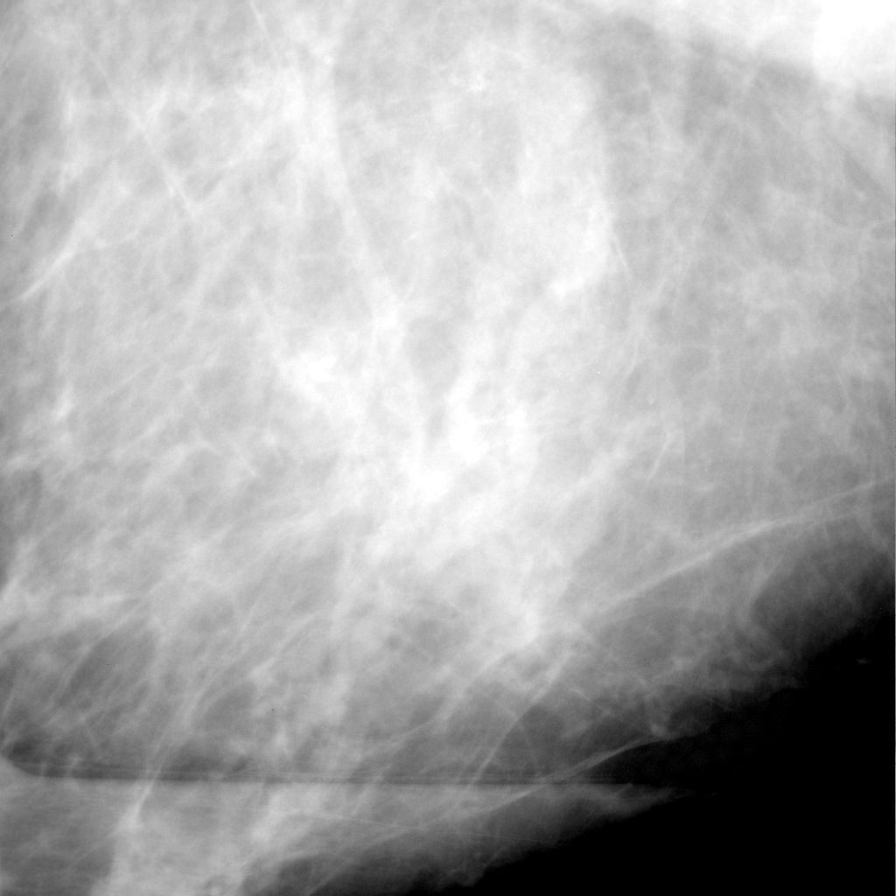

[LM (2 of 7)]
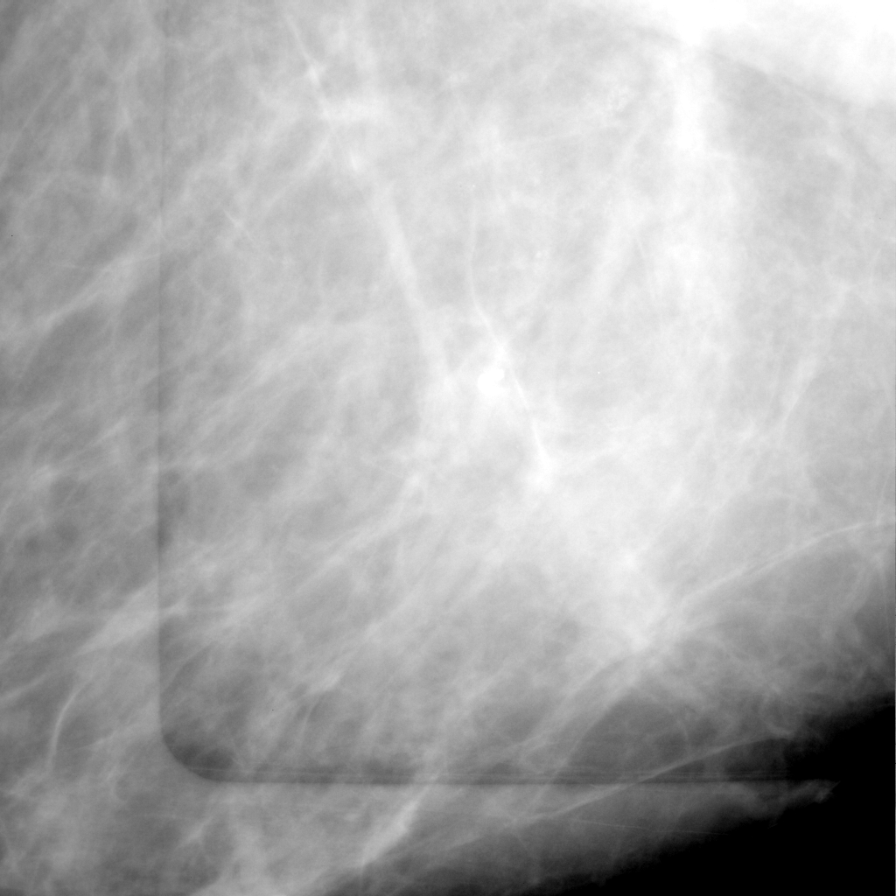

[LM (3 of 7)]
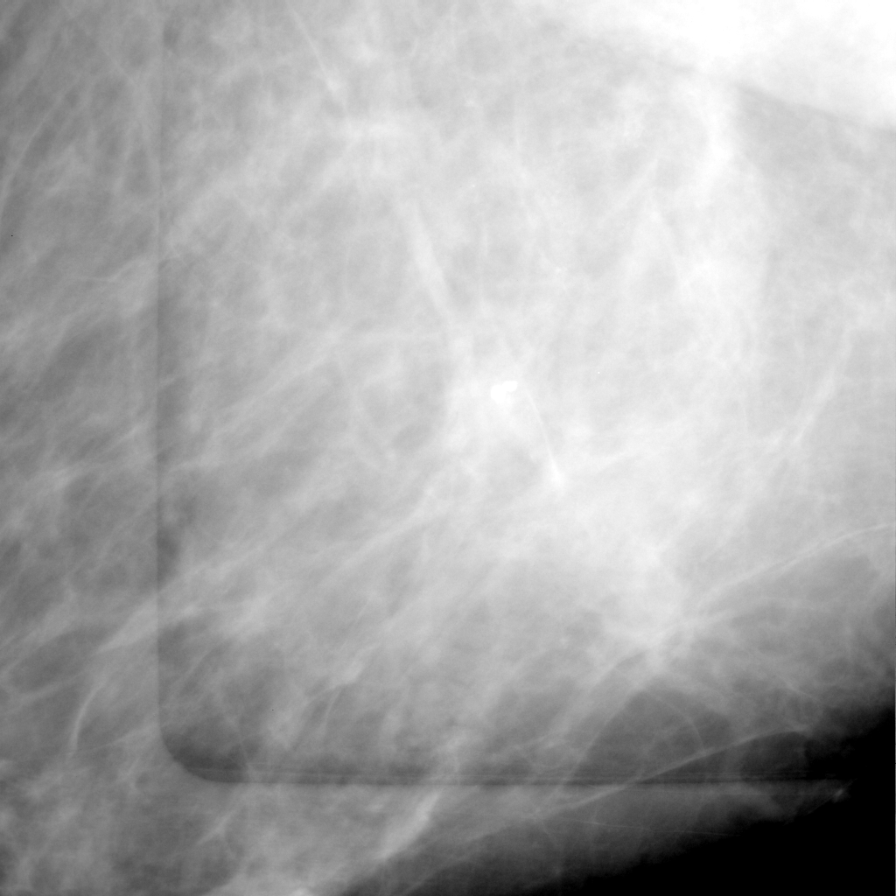

[LM (4 of 7)]
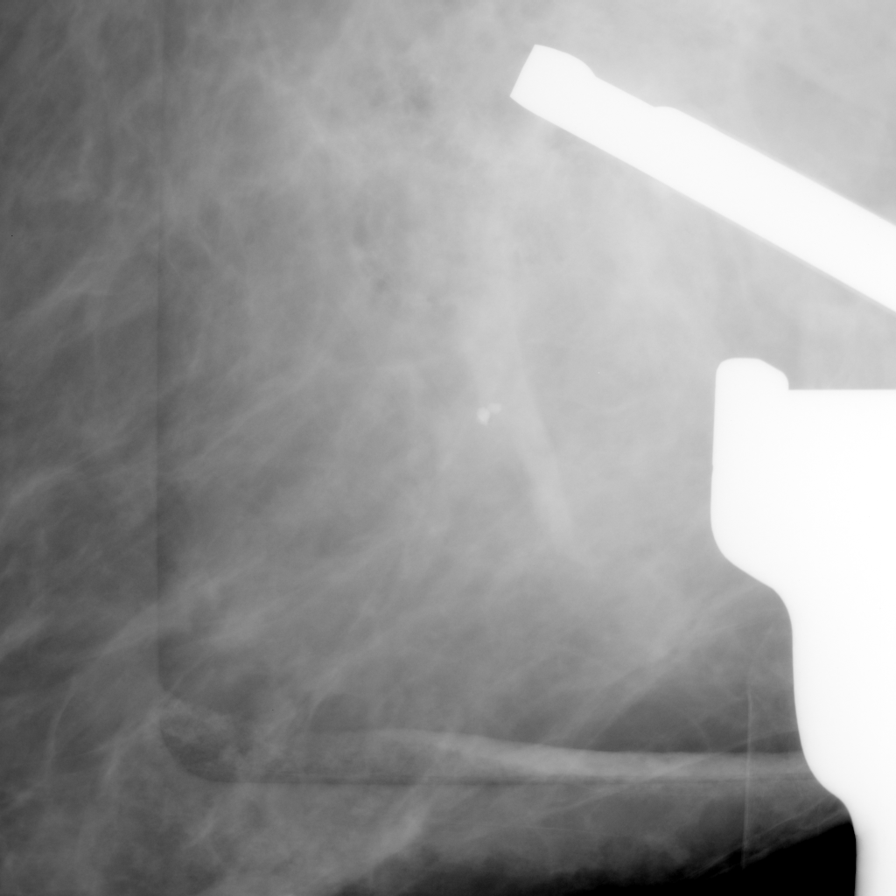

[LM (5 of 7)]
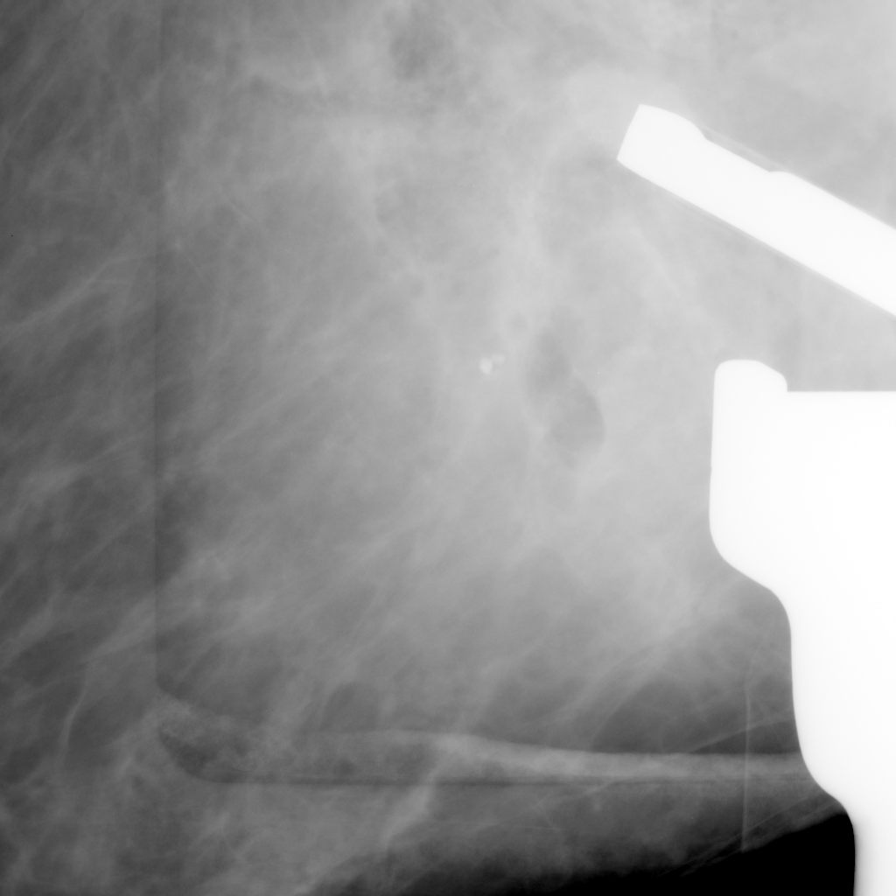

[LM (6 of 7)]
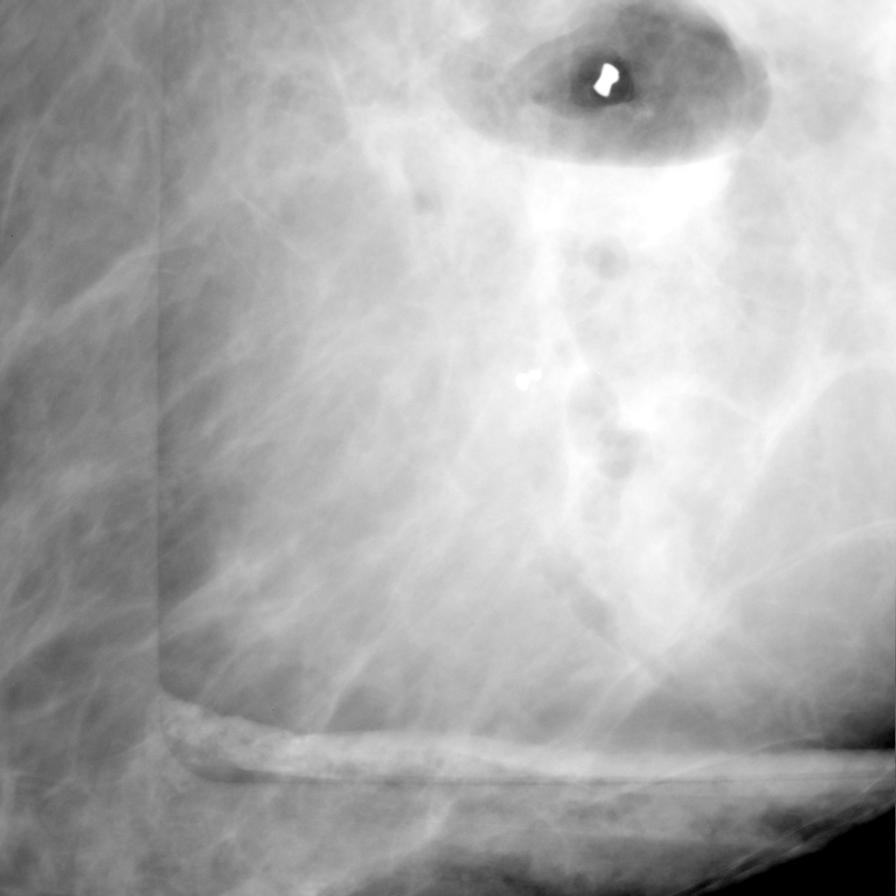

[LM (7 of 7)]
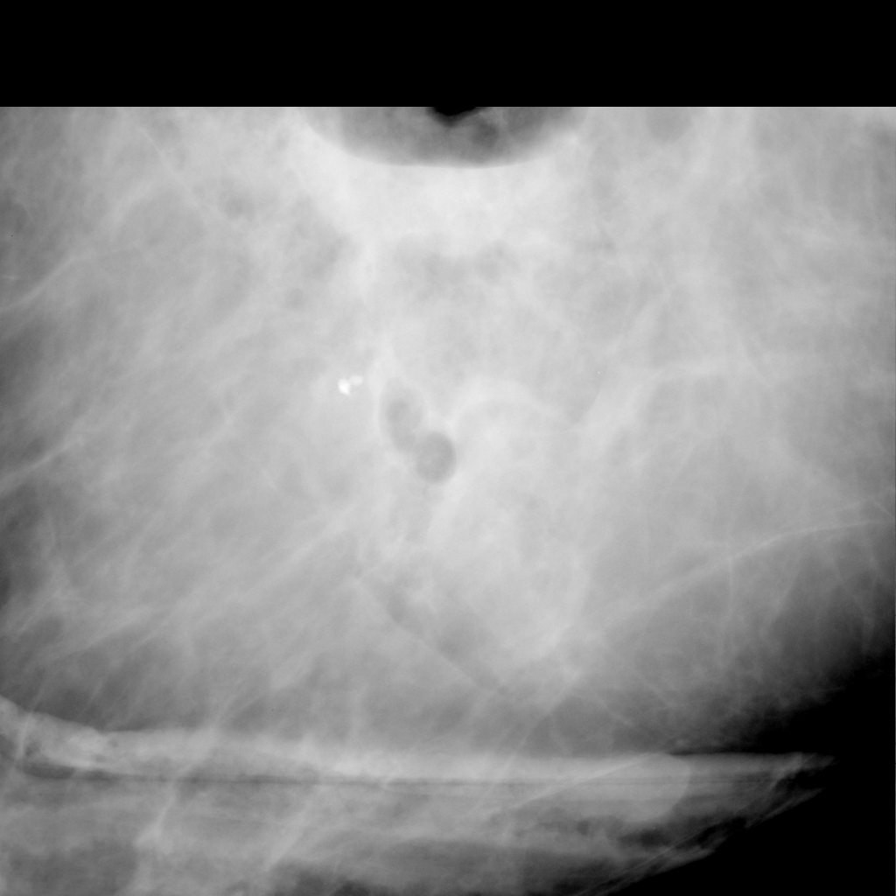

[7 of 7 positions shown; findings below may reference images not displayed]

Canned report from images found in remote index.

Refer to host system for actual result text.

## 2015-07-29 ENCOUNTER — Telehealth: Payer: Self-pay | Admitting: Internal Medicine

## 2015-09-09 ENCOUNTER — Ambulatory Visit: Payer: Self-pay

## 2015-11-06 ENCOUNTER — Ambulatory Visit: Payer: Self-pay | Attending: Oncology

## 2017-02-24 NOTE — Telephone Encounter (Signed)
See order(s).

## 2018-10-14 NOTE — Telephone Encounter (Signed)
error
